# Patient Record
Sex: Male | Born: 1980 | Hispanic: Yes | Marital: Married | Smoking: Former smoker
Health system: Southern US, Community
[De-identification: ages and names within clinical notes are randomized; demographics above are authoritative.]

## PROBLEM LIST (undated history)

## (undated) HISTORY — PX: OTHER SURGICAL HISTORY: SHX169

---

## 2015-05-02 DIAGNOSIS — X58XXXA Exposure to other specified factors, initial encounter: Secondary | ICD-10-CM

## 2015-05-02 HISTORY — DX: Exposure to other specified factors, initial encounter: X58.XXXA

## 2015-11-20 ENCOUNTER — Emergency Department (HOSPITAL_COMMUNITY): Payer: No Typology Code available for payment source

## 2015-11-20 ENCOUNTER — Encounter (HOSPITAL_COMMUNITY): Payer: Self-pay | Admitting: Emergency Medicine

## 2015-11-20 ENCOUNTER — Emergency Department (HOSPITAL_COMMUNITY)
Admission: EM | Admit: 2015-11-20 | Discharge: 2015-11-20 | Disposition: A | Discharge status: Home / Self Care | Payer: No Typology Code available for payment source | Attending: Emergency Medicine | Admitting: Emergency Medicine

## 2015-11-20 DIAGNOSIS — S82831A Other fracture of upper and lower end of right fibula, initial encounter for closed fracture: Secondary | ICD-10-CM

## 2015-11-20 DIAGNOSIS — M25571 Pain in right ankle and joints of right foot: Secondary | ICD-10-CM | POA: Insufficient documentation

## 2015-11-20 DIAGNOSIS — S0101XA Laceration without foreign body of scalp, initial encounter: Secondary | ICD-10-CM | POA: Insufficient documentation

## 2015-11-20 DIAGNOSIS — Y9389 Activity, other specified: Secondary | ICD-10-CM | POA: Diagnosis not present

## 2015-11-20 DIAGNOSIS — Y999 Unspecified external cause status: Secondary | ICD-10-CM | POA: Diagnosis not present

## 2015-11-20 DIAGNOSIS — S82401A Unspecified fracture of shaft of right fibula, initial encounter for closed fracture: Secondary | ICD-10-CM | POA: Diagnosis not present

## 2015-11-20 DIAGNOSIS — Y92410 Unspecified street and highway as the place of occurrence of the external cause: Secondary | ICD-10-CM | POA: Diagnosis not present

## 2015-11-20 DIAGNOSIS — S098XXA Other specified injuries of head, initial encounter: Secondary | ICD-10-CM | POA: Diagnosis present

## 2015-11-20 MED ORDER — OXYCODONE-ACETAMINOPHEN 5-325 MG PO TABS
1.0000 | ORAL_TABLET | Freq: Once | ORAL | Status: AC
  Administered 2015-11-20: 1 via ORAL
  Filled 2015-11-20: qty 1

## 2015-11-20 MED ORDER — HYDROCODONE-ACETAMINOPHEN 5-325 MG PO TABS: 1.0000 | ORAL_TABLET | ORAL | Status: AC | PRN

## 2015-11-20 MED ORDER — LIDOCAINE-EPINEPHRINE (PF) 2 %-1:200000 IJ SOLN
20.0000 mL | Freq: Once | INTRAMUSCULAR | Status: DC
  Filled 2015-11-20: qty 20

## 2015-11-20 MED ORDER — LIDOCAINE-EPINEPHRINE 2 %-1:100000 IJ SOLN
20.0000 mL | Freq: Once | INTRAMUSCULAR | Status: AC
  Administered 2015-11-20: 20 mL
  Filled 2015-11-20: qty 1

## 2015-11-20 MED ORDER — CEPHALEXIN 500 MG PO CAPS: 500.0000 mg | ORAL_CAPSULE | Freq: Three times a day (TID) | ORAL | Status: AC

## 2015-11-20 MED ORDER — IBUPROFEN 200 MG PO TABS
600.0000 mg | ORAL_TABLET | Freq: Once | ORAL | Status: AC
  Administered 2015-11-20: 600 mg via ORAL
  Filled 2015-11-20: qty 3
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
## 2015-11-20 NOTE — ED Notes (Addendum)
MD at bedside. 

## 2015-11-20 NOTE — Discharge Instructions (Signed)
Cuidados del yeso o la frula (Cast or Splint Care) El yeso y las frulas sostienen los miembros lesionados y evitan que los huesos se muevan hasta que se curen. Es importante que cuide el yeso o la frula cuando se encuentre en su casa.  INSTRUCCIONES PARA EL CUIDADO EN EL HOGAR  Mantenga el yeso o la frula al descubierto durante el tiempo de secado. Puede tardar Benjamin Parrish 24 y 2 horas para secarse si est hecho de yeso. La fibra de vidrio se seca en menos de 1 hora.  No apoye el yeso sobre nada que sea ms duro que una almohada durante 24 horas.  No aplique peso sobre el miembro lesionado ni haga presin sobre el yeso hasta que el mdico lo autorice.  Mantenga el yeso o la frula secos. Al mojarse pueden perder la forma y podra ocurrir que no soporten el [place]. Un yeso mojado que ha perdido su forma puede presionar de Geographical information systems officer peligrosa en la piel al secarse. Adems, la piel mojada podra infectarse.  Cubra el yeso o la frula con una bolsa plstica cuando tome un bao o cuando salga al exterior en das de lluvia o nieve. Si el yeso est colocado sobre el tronco, deber baarse pasando una esponja por el cuerpo, hasta que se lo retiren.  Si el yeso se moja, squelo con una toalla o con un secador de cabello slo en posicin de aire fro.  Mantenga el yeso o la frula limpios. Si el yeso se ensucia, puede limpiarlo con un pao hmedo.  No coloque objetos extraos duros o blandos debajo del yeso o cabestrillo, como algodn, papel higinico, locin o talco.  No se rasque la piel por debajo del molde con ningn objeto. Podra quedar adherido al yeso. Adems, el rascado puede causar una infeccin. Si siente picazn, use un secador de cabello con aire fro [employer] zona que pica para [employer].  No recorte ni quite el relleno acolchado que se encuentra debajo del yeso.  Ejercite todas las articulaciones que no estn inmovilizadas por el yeso o frula. Por ejemplo, si tiene un yeso  largo de pierna, ejercite la articulacin de la cadera y los dedos de los pies. Si tiene un brazo [employer]oco[employer] o entablillado, ejercite el hombro, el codo, el pulgar y los dedos de la [place].  Eleve el brazo o la pierna sobre 1  2 almohadas durante los primeros 3 das para disminuir la hinchazn y Conservation officer, historic buildings.Es mejor si puede elevar cmodamente el yeso para que quede ms [place] del nivel del corazn. SOLICITE ATENCIN MDICA SI:   El yeso o la frula se quiebran.  Siente que el yeso o la frula estn muy apretados o muy flojos.  Tiene una picazn insoportable debajo del yeso.  El yeso se moja o tiene una zona blanda.  Siente un feo [employer] proviene del interior del [place].  Algn objeto se queda atascado bajo el yeso.  La piel que rodea el yeso enrojece o se vuelve sensible.  Siente un dolor nuevo o el dolor que senta empeora luego de la aplicacin del yeso. SOLICITE ATENCIN MDICA DE INMEDIATO SI:   Observa un lquido que sale por el yeso.  No puede mover el dedo lesionado.  Los dedos le cambian de color (blancos o azules), siente fro, Social research officer, government o por fuera del yeso los dedos estn muy inflamados.  Siente hormigueo o adormecimiento alrededor de la zona de la lesin.  Siente un dolor o presin intensos debajo del yeso.  Presenta dificultad para respirar o Company secretary.  Siente dolor en el pecho.   Esta informacin no tiene Theme park manager el consejo del mdico. Asegrese de hacerle al mdico cualquier pregunta que tenga.   Document Released: 04/17/2005 Document Revised: 02/05/2013 Elsevier Interactive Patient Education 2016 [employer].  Cuidado de un desgarro en los adultos (Laceration Care, Adult) Un desgarro es un corte que atraviesa todas las capas de la piel y llega al tejido que se encuentra debajo de la piel. Algunos desgarros cicatrizan por s solos. Otros se deben cerrar con puntos (suturas), grapas, tiras [place] para la piel. El cuidado adecuado de  Patent examiner reduce al [employer]	[place] riesgo de infecciones y [place] a una mejor cicatrizacin. CMO CUIDAR DEL DESGARRO Si se utilizaron suturas o grapas:  Mantenga la herida limpia y [place].  Si le colocaron una venda (vendaje), debe cambiarla al menos una vez al da o como se lo haya indicado el mdico. Tambin debe cambiarla si se moja o se ensucia.  Mantenga la herida completamente seca durante las primeras 24horas o como se lo haya indicado el mdico. Transcurrido ese tiempo, puede ducharse o tomar baos de inmersin. No obstante, asegrese de no sumergir la herida en agua hasta que le hayan quitado las suturas o las grapas.  Limpie la herida una vez al da o como se lo haya indicado el mdico:  Lave la herida con agua y [place].  Enjuguela con agua para quitar todo el [place].  Seque dando palmaditas con una toalla limpia. No frote la herida.  Despus de limpiar la herida, aplique una delgada capa de ungento con antibitico como se lo haya indicado el mdico. Esto ayudar a prevenir las infecciones y a Public librarian el vendaje se adhiera a la herida.  Las suturas o las grapas deben retirarse como lo haya indicado el mdico. Si se utilizaron tiras [id number]:  Mantenga la herida limpia y seca.  Si le colocaron una venda (vendaje), debe cambiarla al menos una vez al da o como se lo haya indicado el mdico. Tambin debe cambiarla si se moja o se ensucia.  No deje que las tiras [address] se mojen. Puede baarse o ducharse, pero tenga cuidado de no mojar la herida.  Si se moja, squela dando palmaditas con una toalla limpia. No frote la herida.  Las tiras Katie se caen solas. Puede recortar las tiras a medida que la herida Warden/ranger. No quite las tiras [employer] an estn pegadas a la herida. Ellas se caern cuando sea el momento. Si se [place] pegamento para la piel:  Trate de Photographer herida seca; sin embargo, puede mojarla ligeramente cuando se bae o se duche. No sumerja la  herida en el agua, por ejemplo, al nadar.  Despus de ducharse o baarse, seque la herida con cuidado dando palmaditas con una toalla limpia. No frote la herida.  No practique actividades que lo hagan transpirar mucho hasta que el [place] se haya salido solo.  No aplique lquidos, cremas ni ungentos medicinales en la herida mientras est el [employer]. De lo contrario, puede despegar la pelcula de adhesivo antes de que la herida cicatrice.  Si le colocaron una venda (vendaje), debe cambiarla al menos una vez al da o como se lo haya indicado el mdico. Tambin debe cambiarla si se moja o se ensucia.  Si la herida est cubierta con un vendaje, tenga cuidado de no aplicar cinta adhesiva directamente [employer]. De lo contrario, Scientist, product/process development  que el [place] se despegue antes de que la herida haya cicatrizado.  No toque el [place]. Normalmente, el [employer] piel de 5 a 10das y [employer] se Aeronautical engineer. Instrucciones generales  [employer] de venta libre y los recetados solamente como se lo haya indicado el mdico.  Si le recetaron un ungento o un medicamento con antibitico, aplquelo o tmelo como se lo haya indicado el mdico. No deje de usarlo aunque la afeccin mejore.  Para ayudar a evitar la formacin de cicatrices, cbrase la herida con pantalla solar siempre que est al aire libre despus de que le hayan retirado los puntos o las tiras [place] o cuando todava tenga el [employer] en la piel y la herida haya cicatrizado. Use una pantalla solar con factor de proteccin solar (FPS) de por lo menos30.  No se rasque ni se toque la herida.  Concurra a todas las visitas de control como se lo haya indicado el mdico. Esto es importante.  Controle la herida [employer] para detectar signos de infeccin. Est atento a lo siguiente:  Dolor, hinchazn o enrojecimiento.  Lquido, sangre o pus.  Cuando est sentado o acostado, eleve la zona de la lesin por  encima del nivel del corazn, si es posible. SOLICITE ATENCIN MDICA SI:  Le aplicaron la antitetnica y tiene hinchazn, dolor intenso, enrojecimiento o hemorragia en el sitio de la inyeccin.  Tiene fiebre.  La herida estaba cerrada y se abre.  Percibe que sale mal olor de la herida o del vendaje.  Nota un cuerpo extrao en la herida, como un trozo de [place] o vidrio.  El dolor no se alivia con los [employer].  Tiene ms enrojecimiento, hinchazn o dolor en el lugar de la herida.  Observa lquido, sangre o pus que salen de la herida.  Observa que la piel cerca de la herida cambia de color.  Debe cambiar el vendaje con frecuencia debido a que hay secrecin de lquido, sangre o pus de la herida.  Aparece una nueva erupcin cutnea.  Tiene entumecimiento alrededor de la herida. SOLICITE ATENCIN MDICA DE INMEDIATO SI:  Tiene mucha hinchazn alrededor de la herida.  El dolor aumenta repentinamente y es intenso.  Tiene bultos dolorosos cerca de la herida o en la piel en cualquier parte del cuerpo.  Tiene una lnea roja que sale de la herida.  La herida est en la mano o en el pie y no puede mover correctamente uno de los dedos.  La herida est en la mano o en el pie y [employer] dedos tienen un tono plido o [place].   Esta informacin no tiene Theme park manager el consejo del mdico. Asegrese de hacerle al mdico cualquier pregunta que tenga.   Document Released: 04/17/2005 Document Revised: 09/01/2014 Elsevier Interactive Patient Education [employer]

## 2015-11-20 NOTE — ED Notes (Signed)
Patient transported to CT 
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
## 2017-07-13 ENCOUNTER — Encounter (HOSPITAL_COMMUNITY): Payer: Self-pay | Admitting: Emergency Medicine

## 2017-07-13 ENCOUNTER — Emergency Department (HOSPITAL_COMMUNITY)
Admission: EM | Admit: 2017-07-13 | Discharge: 2017-07-13 | Payer: Self-pay | Attending: Emergency Medicine | Admitting: Emergency Medicine

## 2017-07-13 DIAGNOSIS — F10920 Alcohol use, unspecified with intoxication, uncomplicated: Secondary | ICD-10-CM | POA: Insufficient documentation
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
## 2017-07-13 NOTE — ED Provider Notes (Signed)
  [facility]-EMERGENCY DEPT Provider Note   CSN: [id number] Arrival date & time: 07/13/17  1253     History   Chief Complaint Chief Complaint  Patient presents with  . Alcohol Intoxication    HPI [name] is a 37 y.o. male.  HPI Pt is a 37 yo male arrested by police for DWI. Reading was "37" and per their protocol he has to come to the ER for evaluation if the reading is greater than "33". Pt without complaints in the ER at this time. Stable for discharge   History reviewed. No pertinent past medical history.  There are no active problems to display for this patient.   History reviewed. No pertinent surgical history.     Home Medications    Prior to Admission medications   Medication Sig Start Date End Date Taking? Authorizing Provider  cephALEXin (KEFLEX) 500 MG capsule Take 1 capsule (500 mg total) by mouth 3 (three) times daily. 11/20/15   Azalia Bilisampos, Kahleel Fadeley, MD  HYDROcodone-acetaminophen (NORCO/VICODIN) 5-325 MG tablet Take 1 tablet by mouth every 4 (four) hours as needed for moderate pain. 11/20/15   Azalia Bilisampos, Toy Samarin, MD    Family History No family history on file.  Social History Social History   Tobacco Use  . Smoking status: Never Smoker  Substance Use Topics  . Alcohol use: Yes  . Drug use: Not on file     Allergies   Patient has no known allergies.   Review of Systems Review of Systems  All other systems reviewed and are negative.    Physical Exam Updated Vital Signs BP (!) 128/93 (BP Location: Right Arm)   Pulse 76   Temp 98.4 F (36.9 C) (Oral)   Resp 18   SpO2 97%   Physical Exam  Constitutional: He is oriented to person, place, and time. He appears well-developed and well-nourished.  HENT:  Head: Normocephalic.  Eyes: EOM are normal.  Neck: Normal range of motion.  Cardiovascular: Normal rate.  Pulmonary/Chest: Effort normal.  Abdominal: He exhibits no distension.  Musculoskeletal: Normal range of  motion.  Neurological: He is alert and oriented to person, place, and time.  Psychiatric: He has a normal mood and affect.  Nursing note and vitals reviewed.    ED Treatments / Results  Labs (all labs ordered are listed, but only abnormal results are displayed) Labs Reviewed - No data to display  EKG  EKG Interpretation None       Radiology No results found.  Procedures Procedures (including critical care time)  Medications Ordered in ED Medications - No data to display   Initial Impression / Assessment and Plan / ED Course  I have reviewed the triage vital signs and the nursing notes.  Pertinent labs & imaging results that were available during my care of the patient were reviewed by me and considered in my medical decision making (see chart for details).     Well appearing. Medically clear. Dc into police custody  Final Clinical Impressions(s) / ED Diagnoses   Final diagnoses:  Alcoholic intoxication without complication [facility](HCC)    ED Discharge Orders    None       Azalia Bilisampos, Guerin Lashomb, MD 07/13/17 1451

## 2017-07-13 NOTE — ED Notes (Signed)
Bed: WLPT4 Expected date:  Expected time:  Means of arrival:  Comments: 

## 2020-01-05 ENCOUNTER — Inpatient Hospital Stay (HOSPITAL_COMMUNITY): Payer: Self-pay

## 2020-01-05 ENCOUNTER — Emergency Department (HOSPITAL_COMMUNITY): Payer: Self-pay

## 2020-01-05 ENCOUNTER — Inpatient Hospital Stay (HOSPITAL_COMMUNITY)
Admission: EM | Admit: 2020-01-05 | Discharge: 2020-01-23 | DRG: 964 | Disposition: A | Discharge status: Home / Self Care | Payer: Self-pay | Attending: General Surgery | Admitting: General Surgery

## 2020-01-05 DIAGNOSIS — R402422 Glasgow coma scale score 9-12, at arrival to emergency department: Secondary | ICD-10-CM | POA: Diagnosis present

## 2020-01-05 DIAGNOSIS — S06349A Traumatic hemorrhage of right cerebrum with loss of consciousness of unspecified duration, initial encounter: Principal | ICD-10-CM | POA: Diagnosis present

## 2020-01-05 DIAGNOSIS — Y908 Blood alcohol level of 240 mg/100 ml or more: Secondary | ICD-10-CM | POA: Diagnosis present

## 2020-01-05 DIAGNOSIS — E871 Hypo-osmolality and hyponatremia: Secondary | ICD-10-CM | POA: Diagnosis not present

## 2020-01-05 DIAGNOSIS — S0101XA Laceration without foreign body of scalp, initial encounter: Secondary | ICD-10-CM | POA: Diagnosis present

## 2020-01-05 DIAGNOSIS — K5900 Constipation, unspecified: Secondary | ICD-10-CM | POA: Diagnosis not present

## 2020-01-05 DIAGNOSIS — I10 Essential (primary) hypertension: Secondary | ICD-10-CM | POA: Diagnosis not present

## 2020-01-05 DIAGNOSIS — S12500A Unspecified displaced fracture of sixth cervical vertebra, initial encounter for closed fracture: Secondary | ICD-10-CM | POA: Diagnosis present

## 2020-01-05 DIAGNOSIS — S12590A Other displaced fracture of sixth cervical vertebra, initial encounter for closed fracture: Secondary | ICD-10-CM

## 2020-01-05 DIAGNOSIS — S065X9A Traumatic subdural hemorrhage with loss of consciousness of unspecified duration, initial encounter: Secondary | ICD-10-CM | POA: Diagnosis present

## 2020-01-05 DIAGNOSIS — I959 Hypotension, unspecified: Secondary | ICD-10-CM | POA: Diagnosis present

## 2020-01-05 DIAGNOSIS — S06339A Contusion and laceration of cerebrum, unspecified, with loss of consciousness of unspecified duration, initial encounter: Secondary | ICD-10-CM

## 2020-01-05 DIAGNOSIS — M79652 Pain in left thigh: Secondary | ICD-10-CM | POA: Diagnosis present

## 2020-01-05 DIAGNOSIS — D62 Acute posthemorrhagic anemia: Secondary | ICD-10-CM | POA: Diagnosis present

## 2020-01-05 DIAGNOSIS — N390 Urinary tract infection, site not specified: Secondary | ICD-10-CM | POA: Diagnosis not present

## 2020-01-05 DIAGNOSIS — Z23 Encounter for immunization: Secondary | ICD-10-CM

## 2020-01-05 DIAGNOSIS — Z20822 Contact with and (suspected) exposure to covid-19: Secondary | ICD-10-CM | POA: Diagnosis present

## 2020-01-05 DIAGNOSIS — F10239 Alcohol dependence with withdrawal, unspecified: Secondary | ICD-10-CM | POA: Diagnosis not present

## 2020-01-05 DIAGNOSIS — S129XXA Fracture of neck, unspecified, initial encounter: Secondary | ICD-10-CM

## 2020-01-05 DIAGNOSIS — S12490A Other displaced fracture of fifth cervical vertebra, initial encounter for closed fracture: Secondary | ICD-10-CM

## 2020-01-05 DIAGNOSIS — R509 Fever, unspecified: Secondary | ICD-10-CM

## 2020-01-05 DIAGNOSIS — S50311A Abrasion of right elbow, initial encounter: Secondary | ICD-10-CM | POA: Diagnosis present

## 2020-01-05 DIAGNOSIS — K7460 Unspecified cirrhosis of liver: Secondary | ICD-10-CM | POA: Diagnosis present

## 2020-01-05 DIAGNOSIS — B961 Klebsiella pneumoniae [K. pneumoniae] as the cause of diseases classified elsewhere: Secondary | ICD-10-CM | POA: Diagnosis not present

## 2020-01-05 DIAGNOSIS — S12400A Unspecified displaced fracture of fifth cervical vertebra, initial encounter for closed fracture: Secondary | ICD-10-CM | POA: Diagnosis present

## 2020-01-05 DIAGNOSIS — R000 Tachycardia, unspecified: Secondary | ICD-10-CM | POA: Diagnosis not present

## 2020-01-05 DIAGNOSIS — D696 Thrombocytopenia, unspecified: Secondary | ICD-10-CM | POA: Diagnosis present

## 2020-01-05 DIAGNOSIS — S27322A Contusion of lung, bilateral, initial encounter: Secondary | ICD-10-CM | POA: Diagnosis present

## 2020-01-05 LAB — CBC
HCT: 34.6 % — ABNORMAL LOW (ref 39.0–52.0)
HCT: 19.3 % — ABNORMAL LOW (ref 39.0–52.0)
Hemoglobin: 10.9 g/dL — ABNORMAL LOW (ref 13.0–17.0)
Hemoglobin: 6.1 g/dL — CL (ref 13.0–17.0)
MCH: 26.3 pg (ref 26.0–34.0)
MCH: 26.8 pg (ref 26.0–34.0)
MCHC: 31.5 g/dL (ref 30.0–36.0)
MCHC: 31.6 g/dL (ref 30.0–36.0)
MCV: 83.6 fL (ref 80.0–100.0)
MCV: 84.6 fL (ref 80.0–100.0)
Platelets: 122 10*3/uL — ABNORMAL LOW (ref 150–400)
Platelets: 57 10*3/uL — ABNORMAL LOW (ref 150–400)
RBC: 4.14 MIL/uL — ABNORMAL LOW (ref 4.22–5.81)
RBC: 2.28 MIL/uL — ABNORMAL LOW (ref 4.22–5.81)
RDW: 19.1 % — ABNORMAL HIGH (ref 11.5–15.5)
RDW: 18.3 % — ABNORMAL HIGH (ref 11.5–15.5)
WBC: 6.3 10*3/uL (ref 4.0–10.5)
WBC: 3.6 10*3/uL — ABNORMAL LOW (ref 4.0–10.5)
nRBC: 0 % (ref 0.0–0.2)
nRBC: 0 % (ref 0.0–0.2)

## 2020-01-05 LAB — I-STAT CHEM 8, ED
BUN: 5 mg/dL — ABNORMAL LOW (ref 6–20)
Calcium, Ion: 0.92 mmol/L — ABNORMAL LOW (ref 1.15–1.40)
Chloride: 102 mmol/L (ref 98–111)
Creatinine, Ser: 1.4 mg/dL — ABNORMAL HIGH (ref 0.61–1.24)
Glucose, Bld: 105 mg/dL — ABNORMAL HIGH (ref 70–99)
HCT: 36 % — ABNORMAL LOW (ref 39.0–52.0)
Hemoglobin: 12.2 g/dL — ABNORMAL LOW (ref 13.0–17.0)
Potassium: 4 mmol/L (ref 3.5–5.1)
Sodium: 141 mmol/L (ref 135–145)
TCO2: 28 mmol/L (ref 22–32)

## 2020-01-05 LAB — PROTIME-INR
INR: 1.2 (ref 0.8–1.2)
Prothrombin Time: 14.5 seconds (ref 11.4–15.2)

## 2020-01-05 LAB — URINALYSIS, ROUTINE W REFLEX MICROSCOPIC
Bilirubin Urine: NEGATIVE
Glucose, UA: NEGATIVE mg/dL
Hgb urine dipstick: NEGATIVE
Ketones, ur: NEGATIVE mg/dL
Leukocytes,Ua: NEGATIVE
Nitrite: NEGATIVE
Protein, ur: NEGATIVE mg/dL
Specific Gravity, Urine: 1.034 — ABNORMAL HIGH (ref 1.005–1.030)
pH: 5 (ref 5.0–8.0)

## 2020-01-05 LAB — BASIC METABOLIC PANEL
Anion gap: 9 (ref 5–15)
BUN: <5 mg/dL — ABNORMAL LOW (ref 6–20)
CO2: 21 mmol/L — ABNORMAL LOW (ref 22–32)
Calcium: 6.6 mg/dL — ABNORMAL LOW (ref 8.9–10.3)
Chloride: 108 mmol/L (ref 98–111)
Creatinine, Ser: 0.54 mg/dL — ABNORMAL LOW (ref 0.61–1.24)
GFR calc Af Amer: >60 mL/min (ref >60)
GFR calc non Af Amer: >60 mL/min (ref >60)
Glucose, Bld: 93 mg/dL (ref 70–99)
Potassium: 4 mmol/L (ref 3.5–5.1)
Sodium: 138 mmol/L (ref 135–145)

## 2020-01-05 LAB — COMPREHENSIVE METABOLIC PANEL
ALT: 58 U/L — ABNORMAL HIGH (ref 0–44)
AST: 211 U/L — ABNORMAL HIGH (ref 15–41)
Albumin: 3.3 g/dL — ABNORMAL LOW (ref 3.5–5.0)
Alkaline Phosphatase: 171 U/L — ABNORMAL HIGH (ref 38–126)
Anion gap: 10 (ref 5–15)
BUN: 6 mg/dL (ref 6–20)
CO2: 27 mmol/L (ref 22–32)
Calcium: 7.9 mg/dL — ABNORMAL LOW (ref 8.9–10.3)
Chloride: 102 mmol/L (ref 98–111)
Creatinine, Ser: 0.62 mg/dL (ref 0.61–1.24)
GFR calc Af Amer: >60 mL/min (ref >60)
GFR calc non Af Amer: 60 mL/min — ABNORMAL LOW (ref >60)
Glucose, Bld: 111 mg/dL — ABNORMAL HIGH (ref 70–99)
Potassium: 4.5 mmol/L (ref 3.5–5.1)
Sodium: 139 mmol/L (ref 135–145)
Total Bilirubin: 1.1 mg/dL (ref 0.3–1.2)
Total Protein: 8.1 g/dL (ref 6.5–8.1)

## 2020-01-05 LAB — LACTIC ACID, PLASMA: Lactic Acid, Venous: 2.2 mmol/L (ref 0.5–1.9)

## 2020-01-05 LAB — MRSA PCR SCREENING: MRSA by PCR: NEGATIVE

## 2020-01-05 LAB — HIV ANTIBODY (ROUTINE TESTING W REFLEX): HIV Screen 4th Generation wRfx: NONREACTIVE

## 2020-01-05 LAB — HEMOGLOBIN AND HEMATOCRIT, BLOOD
HCT: 24.8 % — ABNORMAL LOW (ref 39.0–52.0)
Hemoglobin: 8.3 g/dL — ABNORMAL LOW (ref 13.0–17.0)

## 2020-01-05 LAB — ABO/RH: ABO/RH(D): B POS

## 2020-01-05 LAB — MAGNESIUM: Magnesium: 1.6 mg/dL — ABNORMAL LOW (ref 1.7–2.4)

## 2020-01-05 LAB — SAMPLE TO BLOOD BANK

## 2020-01-05 LAB — ETHANOL: Alcohol, Ethyl (B): 507 mg/dL (ref <10)

## 2020-01-05 LAB — PREPARE RBC (CROSSMATCH)

## 2020-01-05 LAB — SARS CORONAVIRUS 2 BY RT PCR (HOSPITAL ORDER, PERFORMED IN ~~LOC~~ HOSPITAL LAB): SARS Coronavirus 2: NEGATIVE

## 2020-01-05 MED ORDER — SODIUM CHLORIDE 0.9 % IV SOLN
INTRAVENOUS | Status: AC | PRN
  Administered 2020-01-05 (×4): 1000 mL via INTRAVENOUS

## 2020-01-05 MED ORDER — DOCUSATE SODIUM 100 MG PO CAPS
100.0000 mg | ORAL_CAPSULE | Freq: Two times a day (BID) | ORAL | Status: DC
  Administered 2020-01-05 – 2020-01-23 (×31): 100 mg via ORAL
  Filled 2020-01-05 (×32): qty 1

## 2020-01-05 MED ORDER — ADULT MULTIVITAMIN W/MINERALS CH
1.0000 | ORAL_TABLET | Freq: Every day | ORAL | Status: DC
  Administered 2020-01-05 – 2020-01-23 (×19): 1 via ORAL
  Filled 2020-01-05 (×19): qty 1

## 2020-01-05 MED ORDER — IOHEXOL 300 MG/ML  SOLN
100.0000 mL | Freq: Once | INTRAMUSCULAR | Status: AC | PRN
  Administered 2020-01-05: 100 mL via INTRAVENOUS

## 2020-01-05 MED ORDER — ONDANSETRON 4 MG PO TBDP: 4.0000 mg | ORAL_TABLET | Freq: Four times a day (QID) | ORAL | Status: DC | PRN

## 2020-01-05 MED ORDER — FOLIC ACID 1 MG PO TABS
1.0000 mg | ORAL_TABLET | Freq: Every day | ORAL | Status: DC
  Administered 2020-01-05 – 2020-01-23 (×19): 1 mg via ORAL
  Filled 2020-01-05 (×19): qty 1

## 2020-01-05 MED ORDER — THIAMINE HCL 100 MG PO TABS
100.0000 mg | ORAL_TABLET | Freq: Every day | ORAL | Status: DC
  Administered 2020-01-05 – 2020-01-23 (×19): 100 mg via ORAL
  Filled 2020-01-05 (×19): qty 1

## 2020-01-05 MED ORDER — LORAZEPAM 2 MG/ML IJ SOLN
1.0000 mg | INTRAMUSCULAR | Status: AC | PRN
  Administered 2020-01-06 (×2): 2 mg via INTRAVENOUS
  Administered 2020-01-06: 1 mg via INTRAVENOUS
  Administered 2020-01-07 (×2): 4 mg via INTRAVENOUS
  Administered 2020-01-07: 2 mg via INTRAVENOUS
  Administered 2020-01-07: 3 mg via INTRAVENOUS
  Administered 2020-01-07: 4 mg via INTRAVENOUS
  Administered 2020-01-07: 2 mg via INTRAVENOUS
  Administered 2020-01-07: 4 mg via INTRAVENOUS
  Administered 2020-01-08 (×3): 2 mg via INTRAVENOUS
  Filled 2020-01-05 (×2): qty 1
  Filled 2020-01-05: qty 2
  Filled 2020-01-05: qty 1
  Filled 2020-01-05 (×2): qty 2
  Filled 2020-01-05 (×3): qty 1
  Filled 2020-01-05: qty 2
  Filled 2020-01-05 (×6): qty 1

## 2020-01-05 MED ORDER — LACTATED RINGERS IV BOLUS
1000.0000 mL | Freq: Once | INTRAVENOUS | Status: AC
  Administered 2020-01-05: 1000 mL via INTRAVENOUS

## 2020-01-05 MED ORDER — TETANUS-DIPHTH-ACELL PERTUSSIS 5-2.5-18.5 LF-MCG/0.5 IM SUSP
0.5000 mL | Freq: Once | INTRAMUSCULAR | Status: AC
  Administered 2020-01-05: 0.5 mL via INTRAMUSCULAR
  Filled 2020-01-05: qty 0.5

## 2020-01-05 MED ORDER — MAGNESIUM SULFATE 4 GM/100ML IV SOLN
4.0000 g | Freq: Once | INTRAVENOUS | Status: AC
  Administered 2020-01-05: 4 g via INTRAVENOUS
  Filled 2020-01-05: qty 100

## 2020-01-05 MED ORDER — THIAMINE HCL 100 MG/ML IJ SOLN: 100.0000 mg | Freq: Every day | INTRAMUSCULAR | Status: DC

## 2020-01-05 MED ORDER — LIDOCAINE-EPINEPHRINE 1 %-1:100000 IJ SOLN
20.0000 mL | Freq: Once | INTRAMUSCULAR | Status: AC
  Administered 2020-01-05: 20 mL
  Filled 2020-01-05: qty 1

## 2020-01-05 MED ORDER — IOHEXOL 350 MG/ML SOLN
80.0000 mL | Freq: Once | INTRAVENOUS | Status: AC | PRN
  Administered 2020-01-05: 80 mL via INTRAVENOUS

## 2020-01-05 MED ORDER — LEVETIRACETAM IN NACL 500 MG/100ML IV SOLN
500.0000 mg | Freq: Two times a day (BID) | INTRAVENOUS | Status: AC
  Administered 2020-01-05 – 2020-01-11 (×14): 500 mg via INTRAVENOUS
  Filled 2020-01-05 (×14): qty 100

## 2020-01-05 MED ORDER — MORPHINE SULFATE (PF) 2 MG/ML IV SOLN
1.0000 mg | INTRAVENOUS | Status: DC | PRN
  Administered 2020-01-07: 1 mg via INTRAVENOUS
  Filled 2020-01-05 (×2): qty 1

## 2020-01-05 MED ORDER — PANTOPRAZOLE SODIUM 40 MG IV SOLR: 40.0000 mg | Freq: Every day | INTRAVENOUS | Status: DC

## 2020-01-05 MED ORDER — LORAZEPAM 1 MG PO TABS
1.0000 mg | ORAL_TABLET | ORAL | Status: AC | PRN
  Administered 2020-01-05 – 2020-01-06 (×2): 1 mg via ORAL
  Administered 2020-01-06: 2 mg via ORAL
  Administered 2020-01-06: 1 mg via ORAL
  Administered 2020-01-07 (×2): 2 mg via ORAL
  Filled 2020-01-05 (×2): qty 1
  Filled 2020-01-05 (×2): qty 2
  Filled 2020-01-05: qty 1
  Filled 2020-01-05: qty 2
  Filled 2020-01-05: qty 1

## 2020-01-05 MED ORDER — ONDANSETRON HCL 4 MG/2ML IJ SOLN
4.0000 mg | Freq: Four times a day (QID) | INTRAMUSCULAR | Status: DC | PRN
  Administered 2020-01-06 (×2): 4 mg via INTRAVENOUS
  Filled 2020-01-05 (×2): qty 2

## 2020-01-05 MED ORDER — SODIUM CHLORIDE 0.9% IV SOLUTION: Freq: Once | INTRAVENOUS | Status: AC

## 2020-01-05 MED ORDER — BISACODYL 10 MG RE SUPP: 10.0000 mg | Freq: Every day | RECTAL | Status: DC | PRN

## 2020-01-05 MED ORDER — CALCIUM GLUCONATE-NACL 2-0.675 GM/100ML-% IV SOLN
2.0000 g | Freq: Once | INTRAVENOUS | Status: AC
  Administered 2020-01-05: 2000 mg via INTRAVENOUS
  Filled 2020-01-05: qty 100

## 2020-01-05 MED ORDER — PANTOPRAZOLE SODIUM 40 MG PO TBEC
40.0000 mg | DELAYED_RELEASE_TABLET | Freq: Every day | ORAL | Status: DC
  Administered 2020-01-05 – 2020-01-06 (×2): 40 mg via ORAL
  Filled 2020-01-05 (×2): qty 1

## 2020-01-05 MED ORDER — OXYCODONE HCL 5 MG PO TABS
5.0000 mg | ORAL_TABLET | ORAL | Status: DC | PRN
  Administered 2020-01-05 – 2020-01-09 (×5): 5 mg via ORAL
  Filled 2020-01-05 (×5): qty 1

## 2020-01-05 MED ORDER — POTASSIUM CHLORIDE IN NACL 20-0.9 MEQ/L-% IV SOLN
INTRAVENOUS | Status: DC
  Filled 2020-01-05 (×10): qty 1000

## 2020-01-05 MED ORDER — CEFAZOLIN SODIUM-DEXTROSE 1-4 GM/50ML-% IV SOLN
1.0000 g | Freq: Once | INTRAVENOUS | Status: AC
  Administered 2020-01-05: 1 g via INTRAVENOUS
  Filled 2020-01-05: qty 50

## 2020-01-05 MED ORDER — OXYCODONE HCL 5 MG PO TABS
10.0000 mg | ORAL_TABLET | ORAL | Status: DC | PRN
  Administered 2020-01-05 – 2020-01-11 (×10): 10 mg via ORAL
  Filled 2020-01-05 (×11): qty 2

## 2020-01-05 MED ORDER — CHLORHEXIDINE GLUCONATE CLOTH 2 % EX PADS
6.0000 | MEDICATED_PAD | Freq: Every day | CUTANEOUS | Status: DC
  Administered 2020-01-05 – 2020-01-22 (×13): 6 via TOPICAL

## 2020-01-05 MED ORDER — ACETAMINOPHEN 325 MG PO TABS
650.0000 mg | ORAL_TABLET | ORAL | Status: DC | PRN
  Administered 2020-01-05 – 2020-01-23 (×15): 650 mg via ORAL
  Filled 2020-01-05 (×15): qty 2

## 2020-01-05 NOTE — ED Notes (Signed)
Switched to warm fluids

## 2020-01-05 NOTE — ED Notes (Signed)
Report given to [facility]

## 2020-01-05 NOTE — Progress Notes (Signed)
BP soft, called and informed Phylliss Blakes MD

## 2020-01-05 NOTE — ED Notes (Signed)
RN and pt to MRI.   °
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
## 2020-01-05 NOTE — Plan of Care (Signed)
  Problem: Education: Goal: Knowledge of General Education information will improve Description: Including pain rating scale, medication(s)/side effects and non-pharmacologic comfort measures Outcome: Progressing   Problem: Clinical Measurements: Goal: Respiratory complications will improve Outcome: Progressing   Problem: Nutrition: Goal: Adequate nutrition will be maintained Outcome: Progressing   Problem: Elimination: Goal: Will not experience complications related to urinary retention Outcome: Progressing   Problem: Pain Managment: Goal: General experience of comfort will improve Outcome: Progressing   

## 2020-01-05 NOTE — ED Notes (Signed)
Called CT - pt needs CT stat per provider

## 2020-01-05 NOTE — Progress Notes (Signed)
°   01/05/20 0251  Clinical Encounter Type  Visited With Health care provider  Visit Type Follow-up;Trauma  Referral From Nurse  Consult/Referral To Chaplain   Went down to ED after upgraded to Lvl 1. No family present. Nurse said they would call back, if needed.   This note was prepared by Chaplain Resident, Tacy Learn, MDiv.  Chaplain remains available as needed through the on-call pager: [phone].

## 2020-01-05 NOTE — ED Notes (Signed)
Bladder scanned pt .  Provider aware
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
## 2020-01-05 NOTE — Progress Notes (Signed)
Only patient belongings brought up and with patient are cut jeans and pair of shoes. Confirmed that there were no other belongings with patient via translator ipad.
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
## 2020-01-05 NOTE — Progress Notes (Signed)
CRITICAL VALUE STICKER  CRITICAL VALUE: Hgb  RECEIVER (on-site recipient of call): Rudean Hitt RN  DATE & TIME NOTIFIED: 01/05/20 1336  MESSENGER (representative from lab):  MD NOTIFIED: Phylliss Blakes MD  TIME OF NOTIFICATION: 1336  RESPONSE: 2 units PRBC ordered

## 2020-01-05 NOTE — Progress Notes (Signed)
   01/05/20 0119  Clinical Encounter Type  Visited With Patient not available  Visit Type Trauma  Referral From Nurse  Consult/Referral To Chaplain   Called down to check in. No family present. Nurse said they would call back, if needed.   This note was prepared by Chaplain Resident, Tacy Learn, MDiv.  Chaplain remains available as needed through the on-call pager: [phone].
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
## 2020-01-05 NOTE — ED Provider Notes (Signed)
LACERATION REPAIR Performed by: Arnoldo Hooker Authorized by: Arnoldo Hooker Consent: Verbal consent obtained. Risks and benefits: risks, benefits and alternatives were discussed Consent given by: patient Patient identity confirmed: provided demographic data Prepped and Draped in normal sterile fashion Wound explored  Laceration Location: right scalp, full thickness with active bleeding  Laceration Length: 15 cm  No Foreign Bodies seen or palpated  Anesthesia: local infiltration  Local anesthetic: lidocaine 2 w/epinephrine  Anesthetic total: 5 ml  Irrigation method: syringe Amount of cleaning: extensive, requiring clot removal and hemostasis of bleeding  Skin closure: staples  Number of sutures: 13  Technique: staples  Patient tolerance: Patient tolerated the procedure well with no immediate complications.    Elpidio Anis, PA-C 01/08/20 0076    Nira Conn, MD 01/08/20 1038

## 2020-01-05 NOTE — ED Notes (Signed)
RN and tec to CT w/ Pt

## 2020-01-06 LAB — TYPE AND SCREEN
ABO/RH(D): B POS
Antibody Screen: NEGATIVE
Unit division: 0
Unit division: 0
Unit division: 0

## 2020-01-06 LAB — BPAM RBC
Blood Product Expiration Date: 202109242359
Blood Product Expiration Date: 202109262359
Blood Product Expiration Date: 202109292359
ISSUE DATE / TIME: 202109060249
ISSUE DATE / TIME: 202109061500
ISSUE DATE / TIME: 202109061740
Unit Type and Rh: 5100
Unit Type and Rh: 7300
Unit Type and Rh: 7300

## 2020-01-06 LAB — CBC
HCT: 26.8 % — ABNORMAL LOW (ref 39.0–52.0)
Hemoglobin: 9.2 g/dL — ABNORMAL LOW (ref 13.0–17.0)
MCH: 29 pg (ref 26.0–34.0)
MCHC: 34.3 g/dL (ref 30.0–36.0)
MCV: 84.5 fL (ref 80.0–100.0)
Platelets: 53 10*3/uL — ABNORMAL LOW (ref 150–400)
RBC: 3.17 MIL/uL — ABNORMAL LOW (ref 4.22–5.81)
RDW: 16.6 % — ABNORMAL HIGH (ref 11.5–15.5)
WBC: 3.6 10*3/uL — ABNORMAL LOW (ref 4.0–10.5)
nRBC: 0.6 % — ABNORMAL HIGH (ref 0.0–0.2)

## 2020-01-06 LAB — COMPREHENSIVE METABOLIC PANEL
ALT: 43 U/L (ref 0–44)
AST: 123 U/L — ABNORMAL HIGH (ref 15–41)
Albumin: 3 g/dL — ABNORMAL LOW (ref 3.5–5.0)
Alkaline Phosphatase: 128 U/L — ABNORMAL HIGH (ref 38–126)
Anion gap: 10 (ref 5–15)
BUN: <5 mg/dL — ABNORMAL LOW (ref 6–20)
CO2: 22 mmol/L (ref 22–32)
Calcium: 8.2 mg/dL — ABNORMAL LOW (ref 8.9–10.3)
Chloride: 98 mmol/L (ref 98–111)
Creatinine, Ser: 0.56 mg/dL — ABNORMAL LOW (ref 0.61–1.24)
GFR calc Af Amer: >60 mL/min (ref >60)
GFR calc non Af Amer: >60 mL/min (ref >60)
Glucose, Bld: 114 mg/dL — ABNORMAL HIGH (ref 70–99)
Potassium: 3.8 mmol/L (ref 3.5–5.1)
Sodium: 130 mmol/L — ABNORMAL LOW (ref 135–145)
Total Bilirubin: 2.2 mg/dL — ABNORMAL HIGH (ref 0.3–1.2)
Total Protein: 6.7 g/dL (ref 6.5–8.1)

## 2020-01-06 MED ORDER — METOPROLOL TARTRATE 25 MG PO TABS
12.5000 mg | ORAL_TABLET | Freq: Two times a day (BID) | ORAL | Status: DC
  Administered 2020-01-06 (×2): 12.5 mg via ORAL
  Filled 2020-01-06 (×2): qty 1

## 2020-01-06 MED ORDER — SPIRITUS FRUMENTI
1.0000 | Freq: Three times a day (TID) | ORAL | Status: DC
  Administered 2020-01-06 – 2020-01-21 (×38): 1 via ORAL
  Filled 2020-01-06 (×47): qty 1
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
## 2020-01-06 NOTE — Progress Notes (Signed)
Patient had 1050 ml of tea colored urine. He had a large output before, but it was amber colored Dr. Janee Morn MD notified, and fluids decreased from 100 ml/hr to 75 ml/hr/. Dr Janee Morn will round on patient
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
## 2020-01-07 ENCOUNTER — Inpatient Hospital Stay (HOSPITAL_COMMUNITY): Payer: Self-pay

## 2020-01-07 LAB — CBC
HCT: 24.5 % — ABNORMAL LOW (ref 39.0–52.0)
Hemoglobin: 7.9 g/dL — ABNORMAL LOW (ref 13.0–17.0)
MCH: 27.9 pg (ref 26.0–34.0)
MCHC: 32.2 g/dL (ref 30.0–36.0)
MCV: 86.6 fL (ref 80.0–100.0)
Platelets: 58 10*3/uL — ABNORMAL LOW (ref 150–400)
RBC: 2.83 MIL/uL — ABNORMAL LOW (ref 4.22–5.81)
RDW: 16.3 % — ABNORMAL HIGH (ref 11.5–15.5)
WBC: 3.8 10*3/uL — ABNORMAL LOW (ref 4.0–10.5)
nRBC: 0 % (ref 0.0–0.2)

## 2020-01-07 LAB — COMPREHENSIVE METABOLIC PANEL
ALT: 38 U/L (ref 0–44)
AST: 99 U/L — ABNORMAL HIGH (ref 15–41)
Albumin: 2.9 g/dL — ABNORMAL LOW (ref 3.5–5.0)
Alkaline Phosphatase: 122 U/L (ref 38–126)
Anion gap: 6 (ref 5–15)
BUN: 5 mg/dL — ABNORMAL LOW (ref 6–20)
CO2: 23 mmol/L (ref 22–32)
Calcium: 8.3 mg/dL — ABNORMAL LOW (ref 8.9–10.3)
Chloride: 100 mmol/L (ref 98–111)
Creatinine, Ser: 0.53 mg/dL — ABNORMAL LOW (ref 0.61–1.24)
GFR calc Af Amer: >60 mL/min (ref >60)
GFR calc non Af Amer: >60 mL/min (ref >60)
Glucose, Bld: 111 mg/dL — ABNORMAL HIGH (ref 70–99)
Potassium: 3.4 mmol/L — ABNORMAL LOW (ref 3.5–5.1)
Sodium: 129 mmol/L — ABNORMAL LOW (ref 135–145)
Total Bilirubin: 1.9 mg/dL — ABNORMAL HIGH (ref 0.3–1.2)
Total Protein: 6.6 g/dL (ref 6.5–8.1)

## 2020-01-07 LAB — BASIC METABOLIC PANEL
Anion gap: 8 (ref 5–15)
BUN: <5 mg/dL — ABNORMAL LOW (ref 6–20)
CO2: 23 mmol/L (ref 22–32)
Calcium: 8.4 mg/dL — ABNORMAL LOW (ref 8.9–10.3)
Chloride: 103 mmol/L (ref 98–111)
Creatinine, Ser: 0.59 mg/dL — ABNORMAL LOW (ref 0.61–1.24)
GFR calc Af Amer: >60 mL/min (ref >60)
GFR calc non Af Amer: >60 mL/min (ref >60)
Glucose, Bld: 93 mg/dL (ref 70–99)
Potassium: 3.8 mmol/L (ref 3.5–5.1)
Sodium: 134 mmol/L — ABNORMAL LOW (ref 135–145)

## 2020-01-07 MED ORDER — SODIUM CHLORIDE 1 G PO TABS
1.0000 g | ORAL_TABLET | Freq: Three times a day (TID) | ORAL | Status: DC
  Administered 2020-01-07 – 2020-01-23 (×48): 1 g via ORAL
  Filled 2020-01-07 (×51): qty 1

## 2020-01-07 MED ORDER — QUETIAPINE FUMARATE 25 MG PO TABS
50.0000 mg | ORAL_TABLET | Freq: Two times a day (BID) | ORAL | Status: DC
  Administered 2020-01-07 – 2020-01-13 (×13): 50 mg via ORAL
  Filled 2020-01-07: qty 1
  Filled 2020-01-07 (×3): qty 2
  Filled 2020-01-07: qty 1
  Filled 2020-01-07 (×2): qty 2
  Filled 2020-01-07: qty 1
  Filled 2020-01-07: qty 2
  Filled 2020-01-07: qty 1
  Filled 2020-01-07 (×2): qty 2
  Filled 2020-01-07: qty 1

## 2020-01-07 MED ORDER — DEXMEDETOMIDINE HCL IN NACL 400 MCG/100ML IV SOLN
0.4000 ug/kg/h | INTRAVENOUS | Status: AC
  Administered 2020-01-07: 0.5 ug/kg/h via INTRAVENOUS
  Filled 2020-01-07: qty 100

## 2020-01-07 MED ORDER — DEXMEDETOMIDINE HCL IN NACL 400 MCG/100ML IV SOLN
0.4000 ug/kg/h | INTRAVENOUS | Status: DC
  Administered 2020-01-07: 1.2 ug/kg/h via INTRAVENOUS
  Administered 2020-01-07: 0.5 ug/kg/h via INTRAVENOUS
  Administered 2020-01-08: 0.4 ug/kg/h via INTRAVENOUS
  Administered 2020-01-08 (×2): 1.2 ug/kg/h via INTRAVENOUS
  Administered 2020-01-08: 1 ug/kg/h via INTRAVENOUS
  Administered 2020-01-08: 0.4 ug/kg/h via INTRAVENOUS
  Filled 2020-01-07 (×8): qty 100

## 2020-01-07 MED ORDER — VITAL HIGH PROTEIN PO LIQD: 1000.0000 mL | ORAL | Status: DC

## 2020-01-07 MED ORDER — PROSOURCE TF PO LIQD
45.0000 mL | Freq: Two times a day (BID) | ORAL | Status: DC
  Filled 2020-01-07: qty 45

## 2020-01-07 NOTE — Progress Notes (Addendum)
Pt confused, attempting to get out of bed to "go to work", removing ekg electrodes and attempting to remove c-collar despite maximum ativan order given . Verbal order obtained by MD Lovick for precedex order. Will continue to monitor.  MD Dwain Sarna also made aware
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
## 2020-01-07 NOTE — Progress Notes (Signed)
Pt's speech slurred/incomprehensible and unable to cooperate with interpretor ipad to answer questions. Pt not as cooperative to follow instructionsto take pills at this time, MD Lovick made aware. Stat CT ordered. Vital signs within normal range. Will continue to monitor

## 2020-01-07 NOTE — Plan of Care (Signed)
°  Problem: Clinical Measurements: Goal: Will remain free from infection Outcome: Progressing Goal: Diagnostic test results will improve Outcome: Progressing Goal: Respiratory complications will improve Outcome: Progressing   Problem: Pain Managment: Goal: General experience of comfort will improve Outcome: Progressing   Problem: Nutrition: Goal: Adequate nutrition will be maintained Outcome: Not Progressing    Problem: Coping: Goal: Level of anxiety will decrease Outcome: Not Progressing   Problem: Elimination: Goal: Will not experience complications related to urinary retention Outcome: Not Progressing

## 2020-01-07 NOTE — Progress Notes (Signed)
Patient very agitated, stating he "wants to leave and go to work."  Disoriented, severe tremors, tachycardia with HR 130-140s, CIWA score 28. PRN ativan given multiple times with no relief of symptoms. Notified Dr Derrell Lolling of ETOH withdrawal and received order for precedex gtt.

## 2020-01-08 LAB — GLUCOSE, CAPILLARY
Glucose-Capillary: 102 mg/dL — ABNORMAL HIGH (ref 70–99)
Glucose-Capillary: 96 mg/dL (ref 70–99)
Glucose-Capillary: 86 mg/dL (ref 70–99)
Glucose-Capillary: 92 mg/dL (ref 70–99)

## 2020-01-08 MED ORDER — METOPROLOL TARTRATE 25 MG PO TABS
12.5000 mg | ORAL_TABLET | Freq: Two times a day (BID) | ORAL | Status: DC
  Administered 2020-01-08 – 2020-01-09 (×4): 12.5 mg via ORAL
  Filled 2020-01-08 (×4): qty 1

## 2020-01-08 MED ORDER — TAMSULOSIN HCL 0.4 MG PO CAPS
0.4000 mg | ORAL_CAPSULE | Freq: Every day | ORAL | Status: DC
  Administered 2020-01-08 – 2020-01-16 (×9): 0.4 mg via ORAL
  Filled 2020-01-08 (×9): qty 1

## 2020-01-08 MED ORDER — LORAZEPAM 2 MG/ML IJ SOLN
1.0000 mg | INTRAMUSCULAR | Status: AC | PRN
  Administered 2020-01-08: 2 mg via INTRAVENOUS
  Filled 2020-01-08 (×2): qty 1

## 2020-01-08 MED ORDER — BETHANECHOL CHLORIDE 25 MG PO TABS
25.0000 mg | ORAL_TABLET | Freq: Three times a day (TID) | ORAL | Status: DC
  Administered 2020-01-08 – 2020-01-14 (×21): 25 mg via ORAL
  Filled 2020-01-08: qty 3
  Filled 2020-01-08: qty 1
  Filled 2020-01-08: qty 3
  Filled 2020-01-08 (×2): qty 1
  Filled 2020-01-08: qty 3
  Filled 2020-01-08 (×3): qty 1
  Filled 2020-01-08: qty 3
  Filled 2020-01-08 (×4): qty 1
  Filled 2020-01-08 (×2): qty 3
  Filled 2020-01-08: qty 1
  Filled 2020-01-08: qty 3
  Filled 2020-01-08: qty 1
  Filled 2020-01-08 (×2): qty 3
  Filled 2020-01-08: qty 1

## 2020-01-08 MED ORDER — LORAZEPAM 1 MG PO TABS: 1.0000 mg | ORAL_TABLET | ORAL | Status: AC | PRN

## 2020-01-08 MED ORDER — CHLORDIAZEPOXIDE HCL 10 MG PO CAPS
10.0000 mg | ORAL_CAPSULE | Freq: Three times a day (TID) | ORAL | Status: DC
  Administered 2020-01-08 – 2020-01-18 (×31): 10 mg via ORAL
  Filled 2020-01-08 (×11): qty 2
  Filled 2020-01-08: qty 1
  Filled 2020-01-08 (×8): qty 2
  Filled 2020-01-08 (×2): qty 1
  Filled 2020-01-08 (×9): qty 2

## 2020-01-08 NOTE — Progress Notes (Signed)
Dr. Dwain Sarna made aware that patient's ativan order is set to discontinue at 5 AM. MD made aware that patient is on CIWA and can get agitated. Verbal order given to renew ativan order in case it is needed.
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
## 2020-01-08 NOTE — Progress Notes (Signed)
SLP Cancellation Note  Patient Details Name: Xaidyn Kepner MRN: [id number] DOB: 07-29-1980   Cancelled treatment:       Reason Eval/Treat Not Completed: Fatigue/lethargy limiting ability to participate   Royetta Crochet 01/08/2020, 2:02 PM
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
## 2020-01-09 LAB — BASIC METABOLIC PANEL
Anion gap: 8 (ref 5–15)
BUN: 8 mg/dL (ref 6–20)
CO2: 23 mmol/L (ref 22–32)
Calcium: 8.5 mg/dL — ABNORMAL LOW (ref 8.9–10.3)
Chloride: 101 mmol/L (ref 98–111)
Creatinine, Ser: 0.62 mg/dL (ref 0.61–1.24)
GFR calc Af Amer: >60 mL/min (ref >60)
GFR calc non Af Amer: >60 mL/min (ref >60)
Glucose, Bld: 81 mg/dL (ref 70–99)
Potassium: 3.5 mmol/L (ref 3.5–5.1)
Sodium: 132 mmol/L — ABNORMAL LOW (ref 135–145)

## 2020-01-09 LAB — CBC
HCT: 25.9 % — ABNORMAL LOW (ref 39.0–52.0)
Hemoglobin: 8.4 g/dL — ABNORMAL LOW (ref 13.0–17.0)
MCH: 28.6 pg (ref 26.0–34.0)
MCHC: 32.4 g/dL (ref 30.0–36.0)
MCV: 88.1 fL (ref 80.0–100.0)
Platelets: 124 10*3/uL — ABNORMAL LOW (ref 150–400)
RBC: 2.94 MIL/uL — ABNORMAL LOW (ref 4.22–5.81)
RDW: 17 % — ABNORMAL HIGH (ref 11.5–15.5)
WBC: 5 10*3/uL (ref 4.0–10.5)
nRBC: 0.4 % — ABNORMAL HIGH (ref 0.0–0.2)

## 2020-01-09 LAB — GLUCOSE, CAPILLARY
Glucose-Capillary: 80 mg/dL (ref 70–99)
Glucose-Capillary: 77 mg/dL (ref 70–99)
Glucose-Capillary: 72 mg/dL (ref 70–99)
Glucose-Capillary: 96 mg/dL (ref 70–99)

## 2020-01-09 MED ORDER — LACTATED RINGERS IV BOLUS
1000.0000 mL | Freq: Once | INTRAVENOUS | Status: AC
  Administered 2020-01-09: 1000 mL via INTRAVENOUS

## 2020-01-09 MED ORDER — ENOXAPARIN SODIUM 30 MG/0.3ML ~~LOC~~ SOLN
30.0000 mg | Freq: Two times a day (BID) | SUBCUTANEOUS | Status: DC
  Administered 2020-01-09 – 2020-01-23 (×28): 30 mg via SUBCUTANEOUS
  Filled 2020-01-09 (×28): qty 0.3
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
## 2020-01-09 NOTE — Progress Notes (Signed)
Rehab Admissions Coordinator Note:  Patient was screened by Clois Dupes for appropriateness for an Inpatient Acute Rehab Consult per therapy recommendations.  At this time, we are recommending Inpatient Rehab consult once able to tolerate more therapies. I will follow.  Clois Dupes RN MSN 01/09/2020, 9:34 AM  I can be reached at [phone].

## 2020-01-10 LAB — GLUCOSE, CAPILLARY
Glucose-Capillary: 98 mg/dL (ref 70–99)
Glucose-Capillary: 124 mg/dL — ABNORMAL HIGH (ref 70–99)
Glucose-Capillary: 127 mg/dL — ABNORMAL HIGH (ref 70–99)

## 2020-01-10 MED ORDER — METOPROLOL TARTRATE 25 MG PO TABS
25.0000 mg | ORAL_TABLET | Freq: Two times a day (BID) | ORAL | Status: DC
  Administered 2020-01-10 – 2020-01-18 (×17): 25 mg via ORAL
  Filled 2020-01-10 (×17): qty 1
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
## 2020-01-11 NOTE — Progress Notes (Signed)
Inpatient Rehab Admissions:  Inpatient Rehab Consult received.  I met with patient at the bedside for rehabilitation assessment and to discuss goals and expectations of an inpatient rehab admission.  [place] interpreter [phone] during encounter.  Pt acknowledged understanding of CIR goals and expectations. Pt interested in pursuing CIR.  At this time, pt unable to provide contact information for family/friends who may be able to provide supervision/assistance after discharge from hospital. Pt also interested in speaking with a financial counselor.  AC called and left message with Shanon Rosser. Will continue to follow.  Signed: Gayland Curry, [place], [place] Admissions Coordinator [phone]

## 2020-01-12 ENCOUNTER — Encounter (HOSPITAL_COMMUNITY): Payer: Self-pay

## 2020-01-12 DIAGNOSIS — S06369A Traumatic hemorrhage of cerebrum, unspecified, with loss of consciousness of unspecified duration, initial encounter: Secondary | ICD-10-CM

## 2020-01-12 LAB — CBC
HCT: 26 % — ABNORMAL LOW (ref 39.0–52.0)
Hemoglobin: 8.4 g/dL — ABNORMAL LOW (ref 13.0–17.0)
MCH: 28.9 pg (ref 26.0–34.0)
MCHC: 32.3 g/dL (ref 30.0–36.0)
MCV: 89.3 fL (ref 80.0–100.0)
Platelets: 199 10*3/uL (ref 150–400)
RBC: 2.91 MIL/uL — ABNORMAL LOW (ref 4.22–5.81)
RDW: 17.2 % — ABNORMAL HIGH (ref 11.5–15.5)
WBC: 5.2 10*3/uL (ref 4.0–10.5)
nRBC: 0 % (ref 0.0–0.2)

## 2020-01-12 MED ORDER — OXYCODONE HCL 5 MG PO TABS
5.0000 mg | ORAL_TABLET | ORAL | Status: DC | PRN
  Administered 2020-01-13: 10 mg via ORAL
  Administered 2020-01-13: 5 mg via ORAL
  Administered 2020-01-14 – 2020-01-22 (×15): 10 mg via ORAL
  Filled 2020-01-12: qty 2
  Filled 2020-01-12: qty 1
  Filled 2020-01-12 (×15): qty 2

## 2020-01-12 MED ORDER — MORPHINE SULFATE (PF) 2 MG/ML IV SOLN: 1.0000 mg | INTRAVENOUS | Status: DC | PRN
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
## 2020-01-13 DIAGNOSIS — S06339A Contusion and laceration of cerebrum, unspecified, with loss of consciousness of unspecified duration, initial encounter: Secondary | ICD-10-CM

## 2020-01-13 LAB — BASIC METABOLIC PANEL
Anion gap: 8 (ref 5–15)
BUN: 6 mg/dL (ref 6–20)
CO2: 25 mmol/L (ref 22–32)
Calcium: 8.9 mg/dL (ref 8.9–10.3)
Chloride: 99 mmol/L (ref 98–111)
Creatinine, Ser: 0.56 mg/dL — ABNORMAL LOW (ref 0.61–1.24)
GFR calc Af Amer: >60 mL/min (ref >60)
GFR calc non Af Amer: >60 mL/min (ref >60)
Glucose, Bld: 81 mg/dL (ref 70–99)
Potassium: 4.2 mmol/L (ref 3.5–5.1)
Sodium: 132 mmol/L — ABNORMAL LOW (ref 135–145)

## 2020-01-13 MED ORDER — LORAZEPAM 2 MG/ML IJ SOLN: 0.5000 mg | Freq: Four times a day (QID) | INTRAMUSCULAR | Status: DC | PRN
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
## 2020-01-13 NOTE — TOC CAGE-AID Note (Signed)
Transition of Care [facility]) - CAGE-AID Screening   Patient Details  Name: Josh Nicolosi MRN: [id number] Date of Birth: 1981-03-06  [facility] CM/SW Contact:    Emeterio Reeve, [place] Phone Number: 01/13/2020, 2:15 PM   Clinical Narrative:  CSW met with pt at bedside. CSW introduced self and explained her role at the hospital.  Pt reports daily alcohol use. Pt reports drinking 3-4 40's a day. Pt reports knowing he needs to stop drinking. Pt denies substance use.   Pt was accepting to resources and educational materials in spanish.   CAGE-AID Screening:    Have You Ever Felt You Ought to Cut Down on Your Drinking or Drug Use?: Yes Have People Annoyed You By Critizing Your Drinking Or Drug Use?: Yes Have You Felt Bad Or Guilty About Your Drinking Or Drug Use?: No Have You Ever Had a Drink or Used Drugs First Thing In The Morning to Steady Your Nerves or to Get Rid of a Hangover?: No CAGE-AID Score: 2  Substance Abuse Education Offered: Yes  Substance abuse interventions: Patient Counseling   Emeterio Reeve, Latanya Presser, [place] Social Worker [phone]

## 2020-01-13 NOTE — Progress Notes (Signed)
[facility] IP rehab admissions - Please see rehab consult done by Dr. Carlis Abbott yesterday.  Patient lives with roommates/friends who work and are not able to assist at DC.  Recommend pursuit of SNF for longer recovery time or can consider home with [facility] if he progresses well.  In addition, beds are currently full on rehab with very limited bed availability this week.  Call me for questions.  [phone]
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
## 2020-01-13 NOTE — Plan of Care (Signed)

## 2020-01-14 ENCOUNTER — Encounter (HOSPITAL_COMMUNITY): Payer: Self-pay

## 2020-01-14 ENCOUNTER — Other Ambulatory Visit: Payer: Self-pay

## 2020-01-14 MED ORDER — GABAPENTIN 100 MG PO CAPS
100.0000 mg | ORAL_CAPSULE | Freq: Three times a day (TID) | ORAL | Status: DC
  Administered 2020-01-14 (×3): 100 mg via ORAL
  Filled 2020-01-14 (×3): qty 1

## 2020-01-14 MED ORDER — POLYETHYLENE GLYCOL 3350 17 G PO PACK
17.0000 g | PACK | Freq: Every day | ORAL | Status: DC
  Administered 2020-01-14 – 2020-01-18 (×4): 17 g via ORAL
  Filled 2020-01-14 (×5): qty 1

## 2020-01-14 MED ORDER — PNEUMOCOCCAL VAC POLYVALENT 25 MCG/0.5ML IJ INJ
0.5000 mL | INJECTION | INTRAMUSCULAR | Status: AC
  Administered 2020-01-15: 0.5 mL via INTRAMUSCULAR
  Filled 2020-01-14: qty 0.5

## 2020-01-14 NOTE — Plan of Care (Signed)
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
## 2020-01-15 MED ORDER — GABAPENTIN 100 MG PO CAPS
200.0000 mg | ORAL_CAPSULE | Freq: Three times a day (TID) | ORAL | Status: DC
  Administered 2020-01-15 – 2020-01-16 (×3): 200 mg via ORAL
  Filled 2020-01-15 (×3): qty 2

## 2020-01-15 MED ORDER — MAGNESIUM CITRATE PO SOLN
0.5000 | Freq: Once | ORAL | Status: AC
  Administered 2020-01-15: 0.5 via ORAL
  Filled 2020-01-15: qty 296
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
## 2020-01-16 LAB — BASIC METABOLIC PANEL
Anion gap: 2 — ABNORMAL LOW (ref 5–15)
BUN: 6 mg/dL (ref 6–20)
CO2: 28 mmol/L (ref 22–32)
Calcium: 8.9 mg/dL (ref 8.9–10.3)
Chloride: 101 mmol/L (ref 98–111)
Creatinine, Ser: 0.54 mg/dL — ABNORMAL LOW (ref 0.61–1.24)
GFR calc Af Amer: >60 mL/min (ref >60)
GFR calc non Af Amer: >60 mL/min (ref >60)
Glucose, Bld: 85 mg/dL (ref 70–99)
Potassium: 3.9 mmol/L (ref 3.5–5.1)
Sodium: 131 mmol/L — ABNORMAL LOW (ref 135–145)

## 2020-01-16 MED ORDER — MAGNESIUM CITRATE PO SOLN
1.0000 | Freq: Once | ORAL | Status: AC
  Administered 2020-01-16: 1 via ORAL
  Filled 2020-01-16: qty 296

## 2020-01-16 MED ORDER — GABAPENTIN 300 MG PO CAPS
300.0000 mg | ORAL_CAPSULE | Freq: Three times a day (TID) | ORAL | Status: DC
  Administered 2020-01-16 – 2020-01-23 (×22): 300 mg via ORAL
  Filled 2020-01-16 (×22): qty 1

## 2020-01-16 MED ORDER — SENNA 8.6 MG PO TABS
1.0000 | ORAL_TABLET | Freq: Every day | ORAL | Status: DC
  Administered 2020-01-16 – 2020-01-23 (×7): 8.6 mg via ORAL
  Filled 2020-01-16 (×8): qty 1

## 2020-01-16 NOTE — Plan of Care (Signed)
  Problem: Education: Goal: Knowledge of General Education information will improve Description: Including pain rating scale, medication(s)/side effects and non-pharmacologic comfort measures Outcome: Progressing   Problem: Nutrition: Goal: Adequate nutrition will be maintained Outcome: Progressing   Problem: Coping: Goal: Level of anxiety will decrease Outcome: Progressing   
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
## 2020-01-16 NOTE — Plan of Care (Signed)
  Problem: Education: Goal: Knowledge of General Education information will improve Description Including pain rating scale, medication(s)/side effects and non-pharmacologic comfort measures Outcome: Progressing   Problem: Health Behavior/Discharge Planning: Goal: Ability to manage health-related needs will improve Outcome: Progressing   
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
## 2020-01-17 NOTE — Plan of Care (Signed)
  Problem: Pain Managment: Goal: General experience of comfort will improve Outcome: Progressing   Problem: Safety: Goal: Ability to remain free from injury will improve Outcome: Progressing   Problem: Skin Integrity: Goal: Risk for impaired skin integrity will decrease Outcome: Progressing   

## 2020-01-17 NOTE — Progress Notes (Signed)
18 staples were removed from patients head. No bleeding after removing them. Patient tolerated well.

## 2020-01-17 NOTE — Plan of Care (Signed)
  Problem: Nutrition: Goal: Adequate nutrition will be maintained Outcome: Progressing   Problem: Coping: Goal: Level of anxiety will decrease Outcome: Progressing   Problem: Elimination: Goal: Will not experience complications related to bowel motility Outcome: Progressing   Problem: Pain Managment: Goal: General experience of comfort will improve Outcome: Progressing   

## 2020-01-18 MED ORDER — CHLORDIAZEPOXIDE HCL 5 MG PO CAPS
10.0000 mg | ORAL_CAPSULE | Freq: Two times a day (BID) | ORAL | Status: DC
  Administered 2020-01-18 – 2020-01-23 (×10): 10 mg via ORAL
  Filled 2020-01-18 (×10): qty 2

## 2020-01-18 MED ORDER — METOPROLOL TARTRATE 12.5 MG HALF TABLET
12.5000 mg | ORAL_TABLET | Freq: Two times a day (BID) | ORAL | Status: DC
  Administered 2020-01-18 – 2020-01-20 (×5): 12.5 mg via ORAL
  Filled 2020-01-18 (×6): qty 1
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
## 2020-01-19 ENCOUNTER — Inpatient Hospital Stay (HOSPITAL_COMMUNITY): Payer: Self-pay

## 2020-01-19 LAB — URINALYSIS, ROUTINE W REFLEX MICROSCOPIC
Bilirubin Urine: NEGATIVE
Glucose, UA: NEGATIVE mg/dL
Hgb urine dipstick: NEGATIVE
Ketones, ur: NEGATIVE mg/dL
Leukocytes,Ua: NEGATIVE
Nitrite: NEGATIVE
Protein, ur: NEGATIVE mg/dL
Specific Gravity, Urine: 1.011 (ref 1.005–1.030)
pH: 7 (ref 5.0–8.0)

## 2020-01-19 LAB — CBC
HCT: 31.9 % — ABNORMAL LOW (ref 39.0–52.0)
Hemoglobin: 9.9 g/dL — ABNORMAL LOW (ref 13.0–17.0)
MCH: 27.3 pg (ref 26.0–34.0)
MCHC: 31 g/dL (ref 30.0–36.0)
MCV: 87.9 fL (ref 80.0–100.0)
Platelets: 506 10*3/uL — ABNORMAL HIGH (ref 150–400)
RBC: 3.63 MIL/uL — ABNORMAL LOW (ref 4.22–5.81)
RDW: 16 % — ABNORMAL HIGH (ref 11.5–15.5)
WBC: 5.9 10*3/uL (ref 4.0–10.5)
nRBC: 0 % (ref 0.0–0.2)

## 2020-01-19 LAB — BASIC METABOLIC PANEL
Anion gap: 5 (ref 5–15)
BUN: 5 mg/dL — ABNORMAL LOW (ref 6–20)
CO2: 29 mmol/L (ref 22–32)
Calcium: 9.4 mg/dL (ref 8.9–10.3)
Chloride: 102 mmol/L (ref 98–111)
Creatinine, Ser: 0.51 mg/dL — ABNORMAL LOW (ref 0.61–1.24)
GFR calc Af Amer: >60 mL/min (ref >60)
GFR calc non Af Amer: >60 mL/min (ref >60)
Glucose, Bld: 102 mg/dL — ABNORMAL HIGH (ref 70–99)
Potassium: 4.2 mmol/L (ref 3.5–5.1)
Sodium: 136 mmol/L (ref 135–145)

## 2020-01-19 MED ORDER — POLYETHYLENE GLYCOL 3350 17 G PO PACK
17.0000 g | PACK | Freq: Two times a day (BID) | ORAL | Status: DC
  Administered 2020-01-19 – 2020-01-23 (×6): 17 g via ORAL
  Filled 2020-01-19 (×8): qty 1
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
## 2020-01-19 NOTE — Progress Notes (Signed)
Inpatient Rehab Admissions Coordinator:   Per PT note, Pt. Ambulating 300 ft with min guard. Therapist also updated recommendations to [facility] PT. Pt. Does not appear to require CIR level therapies. CIR will sign off.   Megan Salon, MS, CCC-SLP Rehab Admissions Coordinator  [phone] (celll) [phone] (office)

## 2020-01-19 NOTE — Plan of Care (Signed)

## 2020-01-19 NOTE — Plan of Care (Signed)
  Problem: Pain Managment: Goal: General experience of comfort will improve Outcome: Progressing   Problem: Safety: Goal: Ability to remain free from injury will improve Outcome: Progressing   Problem: Skin Integrity: Goal: Risk for impaired skin integrity will decrease Outcome: Progressing   
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
## 2020-01-20 NOTE — Plan of Care (Signed)
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
## 2020-01-21 MED ORDER — BETHANECHOL CHLORIDE 5 MG PO TABS
5.0000 mg | ORAL_TABLET | Freq: Three times a day (TID) | ORAL | Status: DC
  Administered 2020-01-21 – 2020-01-23 (×8): 5 mg via ORAL
  Filled 2020-01-21 (×10): qty 1

## 2020-01-21 MED ORDER — AMOXICILLIN-POT CLAVULANATE 875-125 MG PO TABS
1.0000 | ORAL_TABLET | Freq: Two times a day (BID) | ORAL | Status: DC
  Administered 2020-01-22 – 2020-01-23 (×3): 1 via ORAL
  Filled 2020-01-21 (×3): qty 1

## 2020-01-21 MED ORDER — CEFDINIR 300 MG PO CAPS
300.0000 mg | ORAL_CAPSULE | Freq: Two times a day (BID) | ORAL | Status: DC
  Filled 2020-01-21 (×2): qty 1

## 2020-01-21 MED ORDER — MAGNESIUM CITRATE PO SOLN
1.0000 | Freq: Once | ORAL | Status: AC
  Administered 2020-01-21: 1 via ORAL
  Filled 2020-01-21: qty 296

## 2020-01-21 MED ORDER — SODIUM CHLORIDE 0.9 % IV SOLN
1.0000 g | INTRAVENOUS | Status: DC
  Administered 2020-01-21: 1 g via INTRAVENOUS
  Filled 2020-01-21: qty 10

## 2020-01-21 MED ORDER — SPIRITUS FRUMENTI
1.0000 | Freq: Two times a day (BID) | ORAL | Status: DC
  Administered 2020-01-21 – 2020-01-23 (×4): 1 via ORAL
  Filled 2020-01-21 (×6): qty 1

## 2020-01-21 MED ORDER — SODIUM CHLORIDE 0.9 % IV SOLN
INTRAVENOUS | Status: DC | PRN
  Administered 2020-01-21: 250 mL via INTRAVENOUS

## 2020-01-21 MED ORDER — CEFDINIR 300 MG PO CAPS: 300.0000 mg | ORAL_CAPSULE | Freq: Two times a day (BID) | ORAL | Status: DC
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
## 2020-01-21 NOTE — Plan of Care (Signed)
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
## 2020-01-21 NOTE — Discharge Instructions (Addendum)
Cmo utilizar un collarn cervical rgido How to Use a Hard Cervical Collar Un collarn cervical rgido limita el movimiento de la parte superior de la columna vertebral (columna cervical). El collarn sostiene la cabeza y el cuello en una posicin recta. Un collarn cervical puede utilizarse para tratar:  Una fractura en el cuello.  Dao en los ligamentos. Los ligamentos son tejidos que conectan a los [place].  Una lesin en la mdula espinal. Hay varios tipos de collarines cervicales rgidos. Siga las indicaciones del fabricante respecto del [place]. Estas son pautas generales. Cules son los riesgos? Utilizar un collarn cervical es seguro. Sin embargo, pueden ocurrir complicaciones, por ejemplo:  Lesiones cutneas.  Llagas que se forman debido al roce o la presin sobre la piel (lceras por presin).  Dolor.  Dificultad para respirar.  Agravamiento de su afeccin si el collarn no est colocado correctamente.  Aumento del riesgo de Visual merchandiser alimentos o lquidos en los pulmones (aspiracin). Materiales necesarios:  Collarn cervical extra y almohadillas de repuesto.  Hielo.  Bolsa plstica.  Toalla.  Darene Lamer hermtica para colocar sobre el collarn al baarse, si es necesario. Cmo utilizar un collarn cervical  Utilice el collarn cervical como se lo haya indicado el mdico. No se lo quite a menos que se lo haya indicado el mdico.  Es posible que le indiquen que se lo quite solo cuando controla su piel y Human resources officer las almohadillas. Mientras no tenga el collarn colocado: ? Pdale a otra persona que lo ayude si es necesario. ? Mantenga la cabeza y el cuello rectos.  No doble el cuello ni gire la cabeza. ? Revise su piel todos los das para detectar zonas enrojecidas. Pida ayuda o use un espejo para controlar las zonas que usted no puede ver. ? Despus de controlarse la piel, use el collarn cervical extra mientras limpia y [place] las almohadillas del otro  collarn.  Cambie las almohadillas todos los das o con ms frecuencia si se mojan o se ensucian. Mantenga un conjunto limpio de almohadillas de repuesto.  No deje que los bordes de plstico duro toquen su piel. Cbralos con una almohadilla blanda.  Si el collarn cervical no es impermeable: ? No deje que se moje. ? Cbralo con un envoltorio hermtico cuando tome un bao de inmersin o [place]. Siga estas indicaciones en su casa:   Aplique hielo sobre la zona lesionada para Engineer, materials, la rigidez y la hinchazn. ? No se quite el collarn cervical a menos que se lo haya indicado el mdico. ? Ponga el hielo en una bolsa plstica. ? Coloque una [employer] piel y la bolsa o entre el collarn cervical y la bolsa. ? Deje el hielo durante , 2 o 3veces por da, [employer] primeros 2 das despus de la lesin.  No conduzca ningn vehculo hasta que el mdico lo autorice.  Concurra a todas las visitas de [address] se lo haya indicado el mdico. Esto es importante. Cualquier demora en recibir la atencin que necesita puede evitar la recuperacin adecuada de la lesin. Comunquese con un mdico si tiene:  Zonas enrojecidas en la piel debajo del collarn cervical.  Dolor que no se controla con los medicamentos. Solicite ayuda inmediatamente si tiene:  Adormecimiento o debilidad de los brazos o las piernas.  Dificultad para respirar. Estos sntomas pueden representar un problema grave que constituye Radio broadcast assistant. No espere a ver si los sntomas desaparecen. Solicite atencin mdica de inmediato. Comunquese con el servicio de emergencias de  su localidad (911 en los [place]). No conduzca por sus propios medios [employer]. Resumen  Un collarn cervical es un dispositivo que sostiene la barbilla y la nuca. Limita el movimiento del cuello para evitar ms daos despus de una lesin grave.  Un collarn cervical puede utilizarse para tratar una fractura  en el cuello, un dao en los tejidos que conectan a los huesos (ligamentos) o una lesin en la mdula espinal.  Utilice el collarn cervical como se lo haya indicado el mdico. Pregunte si puede quitarse el collarn para ducharse, baarse o comer, o para aplicarse hielo en el cuello. Esta informacin no tiene Theme park manager el consejo del mdico. Asegrese de hacerle al mdico cualquier pregunta que tenga. Document Revised: 11/29/2017 Document Reviewed: 11/29/2017 Elsevier Patient Education  2020 [employer].
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
## 2020-01-22 LAB — URINE CULTURE: Culture: >=100000 — AB
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
## 2020-01-22 NOTE — Plan of Care (Signed)
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
## 2020-01-22 NOTE — Plan of Care (Signed)

## 2020-01-22 NOTE — Plan of Care (Signed)

## 2020-01-22 NOTE — Progress Notes (Addendum)
  Speech Language Pathology Treatment: Cognitive-Linquistic  Patient Details Name: Benjamin Parrish MRN: [id number] DOB: 29-Jan-1981 Today's Date: 01/22/2020 Time: 7829-5621 SLP Time Calculation (min) (ACUTE ONLY): 31 min  Assessment / Plan / Recommendation Clinical Impression  Pt seen in a co treatment session with OT to target attention, working memory, safety awareness and reasoning with dual task awareness. Pt was able to follow multi step directions with a one minute delay between instruction and completion and alternating attention. Pt demonstrated way finding, problem solving in a novel task and alternating attention tasks all during walking, descending stairs and ascending the elevator. He was able to demonstrate basic use of money and also verbalized safety awareness regarding his neck precautions. Reiterated basic safety with OT given his report of getting dizzy once when standing up for the first time that day. Overall, this Spanish speaking SLP could not identify any specific cognitive impairments in this pt that would inhibit his ability to function at baseline at his home with assist from his friend as needed. He does report significant pain; suspect that pain management will be the primary issue that could impact his safety at home at this point. Will f/u as needed for further higher level cognitive tasks, perhaps targeting bill paying/multistep money management.  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

## 2020-01-23 MED ORDER — ACETAMINOPHEN 325 MG PO TABS: 650.0000 mg | ORAL_TABLET | Freq: Four times a day (QID) | ORAL | 0 refills | Status: AC | PRN

## 2020-01-23 MED ORDER — POLYETHYLENE GLYCOL 3350 17 G PO PACK: 17.0000 g | PACK | Freq: Two times a day (BID) | ORAL | 0 refills | Status: AC

## 2020-01-23 MED ORDER — GABAPENTIN 300 MG PO CAPS: 300.0000 mg | ORAL_CAPSULE | Freq: Three times a day (TID) | ORAL | 0 refills | Status: AC

## 2020-01-23 MED ORDER — BETHANECHOL CHLORIDE 5 MG PO TABS: 5.0000 mg | ORAL_TABLET | Freq: Three times a day (TID) | ORAL | 0 refills | Status: AC

## 2020-01-23 MED ORDER — AMOXICILLIN-POT CLAVULANATE 875-125 MG PO TABS: 1.0000 | ORAL_TABLET | Freq: Two times a day (BID) | ORAL | 0 refills | Status: AC

## 2020-01-23 MED ORDER — OXYCODONE HCL 5 MG PO TABS
5.0000 mg | ORAL_TABLET | Freq: Four times a day (QID) | ORAL | 0 refills | Status: AC | PRN
Start: 2020-01-23 — End: ?

## 2020-01-23 MED ORDER — CHLORDIAZEPOXIDE HCL 10 MG PO CAPS: 10.0000 mg | ORAL_CAPSULE | Freq: Two times a day (BID) | ORAL | 0 refills | Status: AC

## 2020-01-23 MED FILL — GABAPENTIN 300 MG CAPSULE: 300 | 30 days supply | Qty: 90 | Fill #0

## 2020-01-23 MED FILL — BETHANECHOL 10 MG TABLET: 10 | 30 days supply | Qty: 45 | Fill #0

## 2020-01-23 MED FILL — ACETAMINOPHEN 325 MG TABS: 325 | 3 days supply | Qty: 30 | Fill #0

## 2020-01-23 MED FILL — POLYETHYLENE GLYCOL 3350 PO: 17 | 30 days supply | Qty: 510 | Fill #0

## 2020-01-23 MED FILL — AMOX-CLAV 875-125 MG TABLET: 875-125 | 4 days supply | Qty: 8 | Fill #0

## 2020-01-23 MED FILL — CHLORDIAZEPOXIDE 5 MG CAP: 5 | 30 days supply | Qty: 120 | Fill #0

## 2020-01-23 MED FILL — oxyCODONE HCL 5 MG TABS: 5 | 20 days supply | Qty: 20 | Fill #0

## 2020-01-23 NOTE — Plan of Care (Signed)
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
## 2020-01-23 NOTE — Progress Notes (Signed)
Provided discharge education/instructions using Spanish video interpreter, all questions and concerns addressed, Pt not in distress, discharged home with belongings.

## 2020-01-26 ENCOUNTER — Encounter (HOSPITAL_COMMUNITY): Payer: Self-pay | Admitting: Emergency Medicine

## 2020-02-10 ENCOUNTER — Encounter: Payer: Self-pay | Admitting: Family

## 2020-02-10 NOTE — Progress Notes (Signed)
Patient did not show for appointment.   
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
